# Patient Record
Sex: Female | Born: 1965 | Race: White | Hispanic: No | Smoking: Current every day smoker
Health system: Southern US, Community
[De-identification: ages and names within clinical notes are randomized; demographics above are authoritative.]

## PROBLEM LIST (undated history)

## (undated) HISTORY — PX: TUBAL LIGATION: SHX77

## (undated) HISTORY — PX: CHOLECYSTECTOMY: SHX55

---

## 2013-12-25 ENCOUNTER — Emergency Department (HOSPITAL_COMMUNITY)
Admission: EM | Admit: 2013-12-25 | Discharge: 2013-12-25 | Disposition: A | Discharge status: Home / Self Care | Payer: Self-pay | Attending: Emergency Medicine | Admitting: Emergency Medicine

## 2013-12-25 ENCOUNTER — Encounter (HOSPITAL_COMMUNITY): Payer: Self-pay | Admitting: Emergency Medicine

## 2013-12-25 ENCOUNTER — Emergency Department (HOSPITAL_COMMUNITY): Payer: Self-pay

## 2013-12-25 DIAGNOSIS — S4980XA Other specified injuries of shoulder and upper arm, unspecified arm, initial encounter: Secondary | ICD-10-CM | POA: Insufficient documentation

## 2013-12-25 DIAGNOSIS — S4992XA Unspecified injury of left shoulder and upper arm, initial encounter: Secondary | ICD-10-CM

## 2013-12-25 DIAGNOSIS — S46909A Unspecified injury of unspecified muscle, fascia and tendon at shoulder and upper arm level, unspecified arm, initial encounter: Secondary | ICD-10-CM | POA: Insufficient documentation

## 2013-12-25 DIAGNOSIS — Y9289 Other specified places as the place of occurrence of the external cause: Secondary | ICD-10-CM | POA: Insufficient documentation

## 2013-12-25 DIAGNOSIS — Y9389 Activity, other specified: Secondary | ICD-10-CM | POA: Insufficient documentation

## 2013-12-25 DIAGNOSIS — X503XXA Overexertion from repetitive movements, initial encounter: Secondary | ICD-10-CM | POA: Insufficient documentation

## 2013-12-25 DIAGNOSIS — F17200 Nicotine dependence, unspecified, uncomplicated: Secondary | ICD-10-CM | POA: Insufficient documentation

## 2013-12-25 MED ORDER — NAPROXEN 500 MG PO TABS: 500.0000 mg | ORAL_TABLET | Freq: Two times a day (BID) | ORAL | Status: DC

## 2013-12-25 MED ORDER — TRAMADOL HCL 50 MG PO TABS: 50.0000 mg | ORAL_TABLET | Freq: Four times a day (QID) | ORAL | Status: AC | PRN

## 2013-12-25 NOTE — Discharge Instructions (Signed)
Be sure to read and understand instructions below prior to leaving the hospital. If your symptoms persist without any improvement in 1 week it is reccommended that you follow up with orthopedics listed above. Use your pain medication as prescribed and do not operate heavy machinery while on pain medication.  Common mechanisms of injury include:   Direct hit (trauma) to the shoulder.  Aging, erosion of the tendon with normal use.  Bony bump on shoulder (acromial spur).  Stress from sudden increase in duration, frequency, or intensity of training  RISK INCREASES WITH:  Contact sports (football, wrestling, boxing).  Throwing sports (baseball, tennis, volleyball).  Weightlifting and bodybuilding.  Heavy labor.  Previous injury to the rotator cuff, including impingement.  Poor shoulder strength and flexibility.  Failure to warm up properly before activity.  Inadequate protective equipment.  Old age.  Bony bump on shoulder (acromial spur).   RELATED COMPLICATIONS  Shoulder stiffness, frozen shoulder, or loss of motion.  Rotator cuff tendon tear.  Recurring symptoms, especially if activity is resumed too soon, with overuse, with a direct blow, or when using poor technique.   TREATMENT  Treatment first involves the use of ice and medicine, to reduce pain and inflammation. The use of strengthening and stretching exercises may help reduce pain with activity. These exercises may be performed at home or with a therapist.  If non-surgical treatment is unsuccessful after more than 6 months, surgery may be advised. MEDICATION  If pain medicine is needed, nonsteroidal anti-inflammatory medicines (Motrin and ibuprofen), or other minor pain relievers (acetaminophen) Prescription pain relievers may be given, if your caregiver thinks they are needed. Use only as directed and only as much as you need  ACTIVITY       - It is reccommended to perform range of motion activity as tolerated to prevent shoulder  stiffness.  HEAT AND COLD  Cold treatment (icing) should be applied for 10 to 15 minutes every 2 to 3 hours for inflammation and pain, and immediately after activity that aggravates your symptoms. Use ice packs or an ice massage.  Heat treatment may be used before performing stretching and strengthening activities prescribed by your caregiver, physical therapist, or athletic trainer. Use a heat pack or a warm water soak.  SEEK IMMEDIATE MEDICAL CARE IF:  Your arm, hand, or fingers are numb or tingling.  Your arm, hand, or fingers are swollen, painful, or turn white or blue.  You develop chest pain or shortness of breath.

## 2013-12-25 NOTE — ED Notes (Signed)
Pt states she lifted a box full of book about 2 months ago and has L shoulder pain since then.

## 2013-12-25 NOTE — ED Provider Notes (Signed)
CSN: [id number]     Arrival date & time 12/25/13  1149 History   First MD Initiated Contact with Patient 12/25/13 1237    This chart was scribed for Kristie Cowman, a non-physician practitioner working with [name], MD by Lewanda Rife, ED Scribe. This patient was seen in room TR10C/TR10C and the patient's care was started at 1:39 PM       Chief Complaint  Patient presents with  . Shoulder Pain     (Consider location/radiation/quality/duration/timing/severity/associated sxs/prior Treatment) The history is provided by the patient. No language interpreter was used.   HPI Comments: Lorraine Grant is a 48 y.o. female who presents to the Emergency Department complaining of persistent left shoulder pain onset 2 months after picking up a heavy box full of books. Describes pain as stabbing sensation. Reports associated swelling of joint. Reports pain is exacerbated by movement and by touch.  Denies trying any alleviating factors. Denies associated numbness, fever, and redness. No prior shoulder injury.   History reviewed. No pertinent past medical history. Past Surgical History  Procedure Laterality Date  . Cholecystectomy    . Cesarean section    . Tubal ligation     No family history on file. History  Substance Use Topics  . Smoking status: Current Every Day Smoker -- 1.00 packs/day  . Smokeless tobacco: Not on file  . Alcohol Use: No   OB History   Grav Para Term Preterm Abortions TAB SAB Ect Mult Living                 Review of Systems  Constitutional: Negative for fever.  Musculoskeletal: Positive for arthralgias.  Skin: Negative for wound.  Neurological: Negative for numbness.      Allergies  Review of patient's allergies indicates no known allergies.  Home Medications   Prior to Admission medications   Medication Sig Start Date End Date Taking? Authorizing Provider  traMADol (ULTRAM) 50 MG tablet Take 25 mg by mouth every 6 (six) hours as needed  for moderate pain.   Yes Historical Provider, MD   BP 123/75  Pulse 53  Resp 18  SpO2 100%  LMP 11/25/2013 Physical Exam  Nursing note and vitals reviewed. Constitutional: She is oriented to person, place, and time. She appears well-developed and well-nourished. No distress.  HENT:  Head: Normocephalic and atraumatic.  Eyes: EOM are normal.  Neck: Neck supple. No tracheal deviation present.  Cardiovascular: Normal rate, regular rhythm, intact distal pulses and normal pulses.   Pulses:      Radial pulses are 2+ on the right side, and 2+ on the left side.  Pulmonary/Chest: Effort normal and breath sounds normal. No respiratory distress.  Musculoskeletal:       Left shoulder: She exhibits decreased range of motion and tenderness. She exhibits no swelling, no crepitus and no deformity.  Left shoulder: No erythema and no warmth. Pain with flexion, extension, and abduction. Decreased ROM secondary to pain.   Neurological: She is alert and oriented to person, place, and time.  Sensation of left hand intact  Skin: Skin is warm and dry. No erythema.  Psychiatric: She has a normal mood and affect. Her behavior is normal.    ED Course  Procedures (including critical care time) COORDINATION OF CARE:  Nursing notes reviewed. Vital signs reviewed. Initial pt interview and examination performed.   Filed Vitals:   12/25/13 1413  BP: 123/75  Pulse: 53  Resp: 18  SpO2: 100%    12:38  PM-Discussed work up plan with pt at bedside, which includes  Orders Placed This Encounter  Procedures  . DG Shoulder Left    Standing Status: Standing     Number of Occurrences: 1     Standing Expiration Date:     Order Specific Question:  Reason for exam:    Answer:  SHOULDER PAIN  . Pt agrees with plan.   Treatment plan initiated:Medications - No data to display   Initial diagnostic testing ordered.       Labs Review Labs Reviewed - No data to display  Imaging Review Dg Shoulder  Left  12/25/2013   CLINICAL DATA:  Left shoulder pain.  EXAM: LEFT SHOULDER - 2+ VIEW  [facility]  [facility]  FINDINGS: There is no evidence of fracture or dislocation. There is no evidence of arthropathy or other focal bone abnormality. Soft tissues are unremarkable.  IMPRESSION: Normal exam, unchanged since 12/14/2013.   Electronically Signed   By: Geanie CooleyJim  Maxwell M.D.   On: 12/25/2013 13:22     EKG Interpretation None      MDM   Final diagnoses:  None   Patient with pain of the left shoulder.  Xray negative.  Patient neurovascularly intact.  No signs of infection.  Feel that the patient is stable for discharge.  Patient given referral to Orthopedist.     Santiago GladHeather Kaleth Koy, PA-C 12/25/13 1645

## 2013-12-28 NOTE — ED Provider Notes (Signed)
Medical screening examination/treatment/procedure(s) were performed by non-physician practitioner and as supervising physician I was immediately available for consultation/collaboration.   EKG Interpretation None       Arman Loy K Linker, MD 12/28/13 1618 

## 2014-11-07 ENCOUNTER — Encounter (HOSPITAL_COMMUNITY): Payer: Self-pay | Admitting: *Deleted

## 2014-11-07 ENCOUNTER — Emergency Department (HOSPITAL_COMMUNITY)
Admission: EM | Admit: 2014-11-07 | Discharge: 2014-11-07 | Disposition: A | Discharge status: Home / Self Care | Payer: Self-pay | Attending: Emergency Medicine | Admitting: Emergency Medicine

## 2014-11-07 DIAGNOSIS — M25512 Pain in left shoulder: Secondary | ICD-10-CM | POA: Insufficient documentation

## 2014-11-07 DIAGNOSIS — Z791 Long term (current) use of non-steroidal anti-inflammatories (NSAID): Secondary | ICD-10-CM | POA: Insufficient documentation

## 2014-11-07 DIAGNOSIS — Z720 Tobacco use: Secondary | ICD-10-CM | POA: Insufficient documentation

## 2014-11-07 DIAGNOSIS — Z79899 Other long term (current) drug therapy: Secondary | ICD-10-CM | POA: Insufficient documentation

## 2014-11-07 DIAGNOSIS — G8929 Other chronic pain: Secondary | ICD-10-CM | POA: Insufficient documentation

## 2014-11-07 MED ORDER — IBUPROFEN 400 MG PO TABS
600.0000 mg | ORAL_TABLET | Freq: Once | ORAL | Status: AC
  Administered 2014-11-07: 600 mg via ORAL
  Filled 2014-11-07: qty 3

## 2014-11-07 MED ORDER — METHOCARBAMOL 500 MG PO TABS: 500.0000 mg | ORAL_TABLET | Freq: Four times a day (QID) | ORAL | Status: AC

## 2014-11-07 MED ORDER — KETOROLAC TROMETHAMINE 60 MG/2ML IM SOLN
60.0000 mg | Freq: Once | INTRAMUSCULAR | Status: DC
  Filled 2014-11-07: qty 2

## 2014-11-07 MED ORDER — NAPROXEN 500 MG PO TABS: 500.0000 mg | ORAL_TABLET | Freq: Two times a day (BID) | ORAL | Status: AC

## 2014-11-07 NOTE — ED Notes (Signed)
Pt reports chronic left shoulder pain, more severe x 2 hours.

## 2014-11-07 NOTE — ED Provider Notes (Signed)
CSN: [id number]     Arrival date & time 11/07/14  [id number] History  This chart was scribed for Rhea BleacherJosh Krisi Azua, PA-C, working with Tilden FossaElizabeth Rees, MD by Chestine SporeSoijett Blue, ED Scribe. The patient was seen in room TR07C/TR07C at 7:27 PM.    Chief Complaint  Patient presents with  . Shoulder Pain    The history is provided by the patient. No language interpreter was used.    HPI Comments: Lorraine Grant is a 49 y.o. female who presents to the Emergency Department complaining of waxing and waning left shoulder pain onset 2 hours ago PTA. Pt reports that she has chronic left shoulder pain that is more severe for the past two hours. Pt injured her shoulder last year by heavy lifting. Pt hasn't had any problems with the shoulder since the initial incident occurred. Pt was dancing in the car today and she had both arms up and she thinks that she moved it a certain way at that time. Pt now has excruciating pain. Pt is able to bend her left elbow with no problem. Pt has not had her shoulder checked out beside her visit to the ED last year. Pt does not have a PCP. She states that she is having associated symptoms of shoulder spasms. She states that she has tried 2 ASA with no relief of her symptoms. She denies left arm pain, tingling, collar bone pain, and any other symptoms. 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   History reviewed. No pertinent past medical history. Past Surgical History  Procedure Laterality Date  . Cholecystectomy    . Cesarean section    . Tubal ligation     History reviewed. No pertinent family history. History  Substance Use Topics  . Smoking status: Current Every Day Smoker -- 1.00 packs/day  . Smokeless tobacco: Not on file  . Alcohol Use: No   OB History    No data available     Review of Systems  Constitutional: Negative for activity change.  Musculoskeletal: Positive for myalgias and arthralgias. Negative for back pain, joint swelling and neck pain.        No left arm pain  Skin: Negative for wound.  Neurological: Negative for weakness and numbness.       No tingling      Allergies  Review of patient's allergies indicates no known allergies.  Home Medications   Prior to Admission medications   Medication Sig Start Date End Date Taking? Authorizing Provider  naproxen (NAPROSYN) 500 MG tablet Take 1 tablet (500 mg total) by mouth 2 (two) times daily. 12/25/13   Heather Laisure, PA-C  traMADol (ULTRAM) 50 MG tablet Take 25 mg by mouth every 6 (six) hours as needed for moderate pain.    Historical Provider, MD  traMADol (ULTRAM) 50 MG tablet Take 1 tablet (50 mg total) by mouth every 6 (six) hours as needed. 12/25/13   Heather Laisure, PA-C   BP 156/83 mmHg  Pulse 109  Temp(Src) 99.1 F (37.3 C) (Oral)  Resp 18  SpO2 99%  LMP 10/07/2014  Physical Exam  Constitutional: She is oriented to person, place, and time. She appears well-developed and well-nourished. No distress.  HENT:  Head: Normocephalic and atraumatic.  Eyes: EOM are normal. Pupils are equal, round, and reactive to light.  Neck: Normal range of motion. Neck supple. No tracheal deviation present.  Cardiovascular: Normal rate.  Exam reveals no decreased pulses.   Pulmonary/Chest: Effort normal. No  respiratory distress.  Musculoskeletal: She exhibits tenderness. She exhibits no edema.       Left shoulder: She exhibits decreased range of motion, tenderness, pain and spasm. She exhibits no bony tenderness and no deformity.       Cervical back: Normal.       Arms: Neurological: She is alert and oriented to person, place, and time. No sensory deficit.  Motor, sensation, and vascular distal to the injury is fully intact.   Skin: Skin is warm and dry.  Psychiatric: She has a normal mood and affect. Her behavior is normal.  Nursing note and vitals reviewed.   ED Course  Procedures (including critical care time) DIAGNOSTIC STUDIES: Oxygen Saturation is 99% on RA, nl by my  interpretation.    COORDINATION OF CARE: 7:31 PM-Discussed treatment plan which includes f/u and referral with orthopedist if the symptoms persists, referral to PCP, sling, toradol injection, robaxin Rx, and Naprosyn Rx with pt at bedside and pt agreed to plan.   Pt later refused toradol. This was changed to ibuprofen.   Labs Review Labs Reviewed - No data to display  Imaging Review No results found.   EKG Interpretation None       Vital signs reviewed and are as follows: Filed Vitals:   11/07/14 1900  BP: 156/83  Pulse: 109  Temp: 99.1 F (37.3 C)  Resp: 18   Patient was counseled on RICE protocol and told to rest injury, use ice for no longer than 15 minutes every hour, compress the area, and elevate above the level of their heart as much as possible to reduce swelling. Questions answered. Patient verbalized understanding.      MDM   Final diagnoses:  Left shoulder pain   Patient with exacerbation of chronic left shoulder pain. No significant trauma to warrant x-ray at this time. X-ray from last year was negative. Left upper extremity is neurovascularly intact. Treat with NSAIDs, RICE, ortho/PCP follow-up.  I personally performed the services described in this documentation, which was scribed in my presence. The recorded information has been reviewed and is accurate.    Renne Crigler, PA-C 11/07/14 2017  Tilden Fossa, MD 11/07/14 2027

## 2014-11-07 NOTE — Discharge Instructions (Signed)
Please read and follow all provided instructions.  Your diagnoses today include:  1. Left shoulder pain     Tests performed today include:  Vital signs. See below for your results today.   Medications prescribed:   Robaxin (methocarbamol) - muscle relaxer medication  DO NOT drive or perform any activities that require you to be awake and alert because this medicine can make you drowsy.    Naproxen - anti-inflammatory pain medication  Do not exceed 500mg  naproxen every 12 hours, take with food  You have been prescribed an anti-inflammatory medication or NSAID. Take with food. Take smallest effective dose for the shortest duration needed for your pain. Stop taking if you experience stomach pain or vomiting.   Take any prescribed medications only as directed.  Home care instructions:   Follow any educational materials contained in this packet  Follow R.I.C.E. Protocol:  R - rest your injury   I  - use ice on injury without applying directly to skin  C - compress injury with bandage or splint  E - elevate the injury as much as possible  Follow-up instructions: Please follow-up with your primary care provider or the provided orthopedic physician (bone specialist) if you continue to have significant pain in 1 week. In this case you may have a more severe injury that requires further care.   Return instructions:   Please return if your toes or feet are numb or tingling, appear gray or blue, or you have severe pain (also elevate the leg and loosen splint or wrap if you were given one)  Please return to the Emergency Department if you experience worsening symptoms.   Please return if you have any other emergent concerns.  Additional Information:  Your vital signs today were: BP 156/83 mmHg   Pulse 109   Temp(Src) 99.1 F (37.3 C) (Oral)   Resp 18   SpO2 99%   LMP 10/07/2014 If your blood pressure (BP) was elevated above 135/85 this visit, please have this repeated by  your doctor within one month.

## 2014-11-07 NOTE — ED Notes (Signed)
Declined W/C at D/C and was escorted to lobby by RN. 

## 2016-03-25 IMAGING — CR DG SHOULDER 2+V*L*
3 series · 3 of 3 positions shown · non-contrast
Comparison: 12/14/2013 at [facility]

CLINICAL DATA: Left shoulder pain.

EXAM:
LEFT SHOULDER - 2+ VIEW

[w shoulder ap internal left]
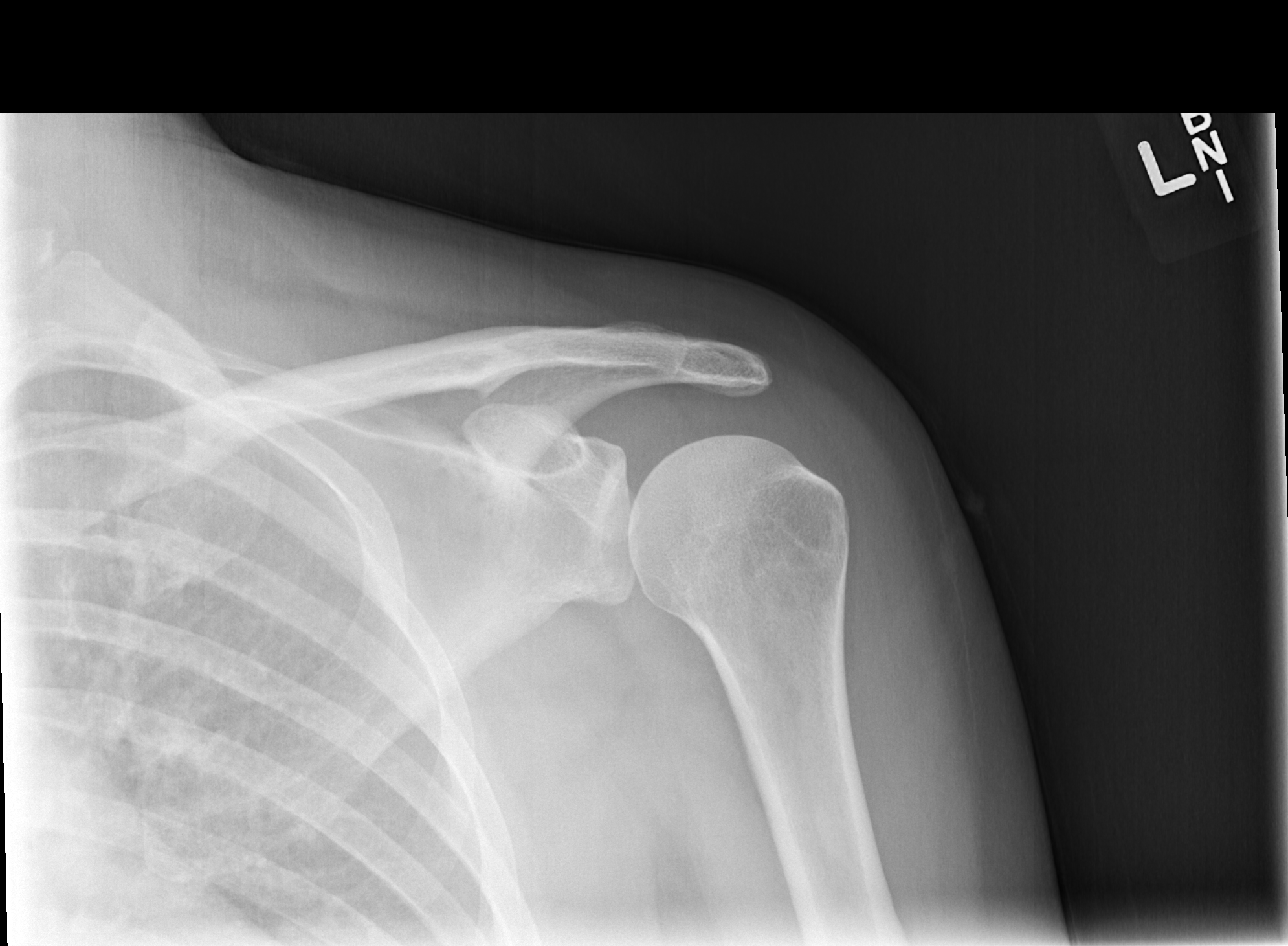

[w shoulder y view left]
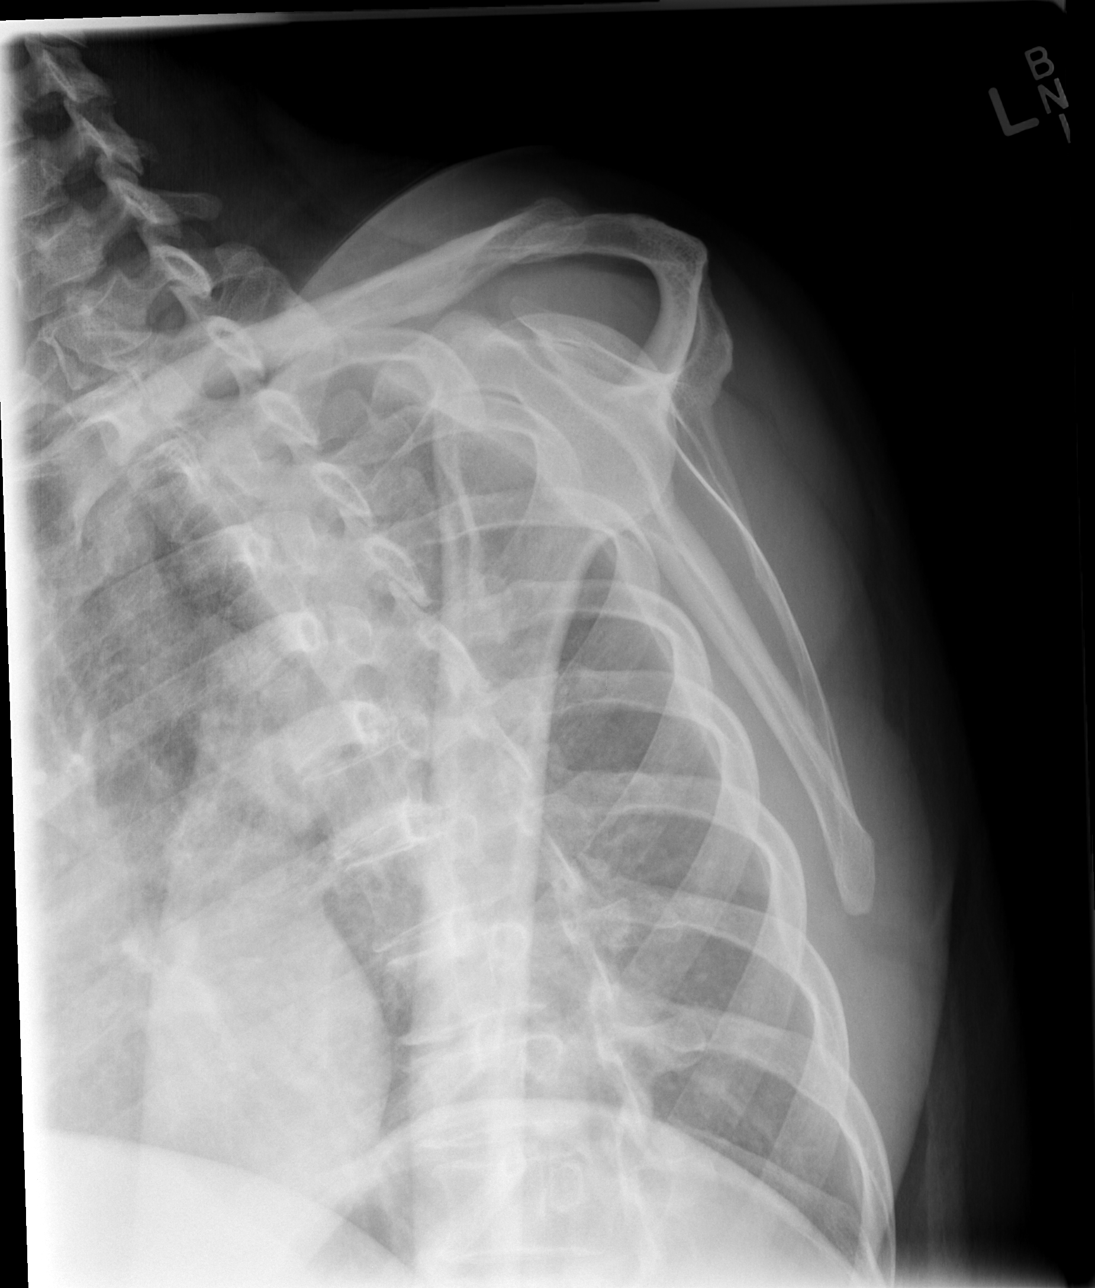

[x shoulder axillary left]
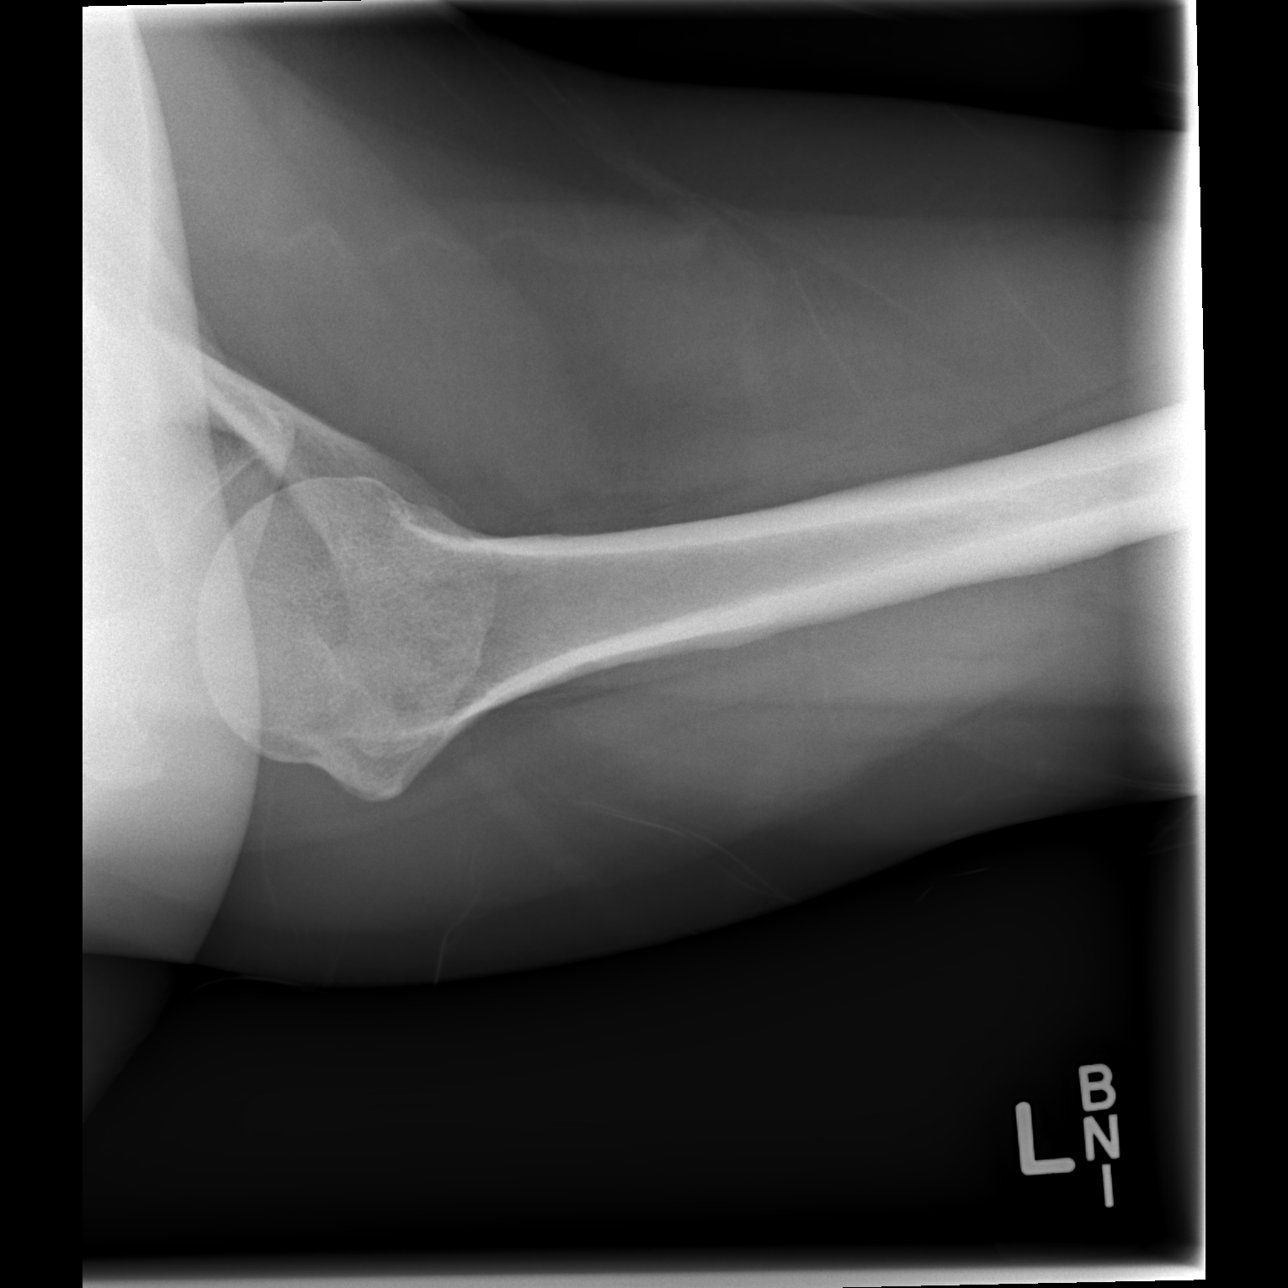

[3 of 3 positions shown; findings below may reference images not displayed]

FINDINGS: There is no evidence of fracture or dislocation. There is no
evidence of arthropathy or other focal bone abnormality. Soft
tissues are unremarkable.
IMPRESSION: Normal exam, unchanged since 12/14/2013.
